# Patient Record
Sex: Female | Born: 1975 | Hispanic: Yes | Marital: Married | Smoking: Never smoker
Health system: Southern US, Community
[De-identification: ages and names within clinical notes are randomized; demographics above are authoritative.]

## PROBLEM LIST (undated history)

## (undated) DIAGNOSIS — E785 Hyperlipidemia, unspecified: Secondary | ICD-10-CM

---

## 2003-03-11 ENCOUNTER — Inpatient Hospital Stay (HOSPITAL_COMMUNITY): Admission: AD | Admit: 2003-03-11 | Discharge: 2003-03-13 | Payer: Self-pay | Admitting: Obstetrics

## 2006-10-31 ENCOUNTER — Ambulatory Visit (HOSPITAL_COMMUNITY): Admission: RE | Admit: 2006-10-31 | Discharge: 2006-10-31 | Payer: Self-pay | Admitting: Family Medicine

## 2007-04-02 ENCOUNTER — Ambulatory Visit: Payer: Self-pay | Admitting: Gynecology

## 2007-04-02 ENCOUNTER — Inpatient Hospital Stay (HOSPITAL_COMMUNITY): Admission: AD | Admit: 2007-04-02 | Discharge: 2007-04-03 | Payer: Self-pay | Admitting: Obstetrics and Gynecology

## 2007-05-21 ENCOUNTER — Emergency Department (HOSPITAL_COMMUNITY): Admission: EM | Admit: 2007-05-21 | Discharge: 2007-05-21 | Payer: Self-pay | Admitting: Emergency Medicine
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
## 2010-04-11 NOTE — L&D Delivery Note (Signed)
Delivery Note At 3:00 PM a viable female was delivered via Vaginal, Spontaneous Delivery (Presentation:ROA ) with mild shoulder dystocia relieved by McRoberts, Suprapubic pressure and rotational maneuvers <30s. NICU present at delivery for light meconium.  Spont cry.  Cord Clamped and Cut.  Infant placed on warmer with awaiting RN. APGAR: 8, 9; weight 8 lb 9.9 oz (3909 g).   Placenta status: Intact, Spontaneous.  Cord: 3 vessels with the following complications: None.  Good uterine firming with fundal massage and pitocin.  Anesthesia: None  Episiotomy: None Lacerations: none   Est. Blood Loss (mL): 400  ml  Mom to postpartum.  Baby to nursery-stable.  Romanda Turrubiates 11/04/2010, 3:30 PM

## 2010-06-02 ENCOUNTER — Other Ambulatory Visit: Payer: Self-pay | Admitting: Family Medicine

## 2010-06-02 DIAGNOSIS — Z3689 Encounter for other specified antenatal screening: Secondary | ICD-10-CM

## 2010-06-02 LAB — HIV ANTIBODY (ROUTINE TESTING W REFLEX): HIV: NONREACTIVE

## 2010-06-02 LAB — ABO/RH

## 2010-06-02 LAB — CBC
HCT: 38 % (ref 36–46)
Platelets: 149 10*3/uL — AB (ref 150–399)

## 2010-06-02 LAB — RUBELLA ANTIBODY, IGM: Rubella: IMMUNE

## 2010-06-07 ENCOUNTER — Encounter (HOSPITAL_COMMUNITY): Payer: Self-pay

## 2010-06-07 ENCOUNTER — Ambulatory Visit (HOSPITAL_COMMUNITY)
Admission: RE | Admit: 2010-06-07 | Discharge: 2010-06-07 | Disposition: A | Discharge status: Home / Self Care | Payer: Medicaid Other | Source: Ambulatory Visit | Attending: Family Medicine | Admitting: Family Medicine

## 2010-06-07 DIAGNOSIS — Z363 Encounter for antenatal screening for malformations: Secondary | ICD-10-CM | POA: Insufficient documentation

## 2010-06-07 DIAGNOSIS — Z1389 Encounter for screening for other disorder: Secondary | ICD-10-CM | POA: Insufficient documentation

## 2010-06-07 DIAGNOSIS — Z3689 Encounter for other specified antenatal screening: Secondary | ICD-10-CM

## 2010-06-07 DIAGNOSIS — O358XX0 Maternal care for other (suspected) fetal abnormality and damage, not applicable or unspecified: Secondary | ICD-10-CM | POA: Insufficient documentation
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
## 2010-10-08 LAB — STREP B DNA PROBE: GBS: NEGATIVE

## 2010-11-04 ENCOUNTER — Encounter (HOSPITAL_COMMUNITY): Payer: Self-pay

## 2010-11-04 ENCOUNTER — Inpatient Hospital Stay (HOSPITAL_COMMUNITY)
Admission: AD | Admit: 2010-11-04 | Discharge: 2010-11-06 | DRG: 775 | Disposition: A | Discharge status: Home / Self Care | Payer: Medicaid Other | Source: Ambulatory Visit | Attending: Obstetrics & Gynecology | Admitting: Obstetrics & Gynecology

## 2010-11-04 DIAGNOSIS — Z862 Personal history of diseases of the blood and blood-forming organs and certain disorders involving the immune mechanism: Secondary | ICD-10-CM

## 2010-11-04 DIAGNOSIS — O0930 Supervision of pregnancy with insufficient antenatal care, unspecified trimester: Secondary | ICD-10-CM

## 2010-11-04 DIAGNOSIS — IMO0001 Reserved for inherently not codable concepts without codable children: Secondary | ICD-10-CM

## 2010-11-04 LAB — CBC
MCH: 31 pg (ref 26.0–34.0)
MCV: 93.3 fL (ref 78.0–100.0)
Platelets: 145 10*3/uL — ABNORMAL LOW (ref 150–400)
RDW: 14.9 % (ref 11.5–15.5)
WBC: 12.7 10*3/uL — ABNORMAL HIGH (ref 4.0–10.5)

## 2010-11-04 LAB — RPR: RPR Ser Ql: NONREACTIVE

## 2010-11-04 MED ORDER — LACTATED RINGERS IV SOLN: 500.0000 mL | INTRAVENOUS | Status: DC | PRN

## 2010-11-04 MED ORDER — FENTANYL CITRATE 0.05 MG/ML IJ SOLN
100.0000 ug | Freq: Once | INTRAMUSCULAR | Status: AC
  Administered 2010-11-04: 100 ug via INTRAVENOUS
  Filled 2010-11-04: qty 2

## 2010-11-04 MED ORDER — BENZOCAINE-MENTHOL 20-0.5 % EX AERO: 1.0000 "application " | INHALATION_SPRAY | CUTANEOUS | Status: DC | PRN

## 2010-11-04 MED ORDER — LIDOCAINE HCL (PF) 1 % IJ SOLN
30.0000 mL | INTRAMUSCULAR | Status: DC | PRN
  Filled 2010-11-04 (×2): qty 30

## 2010-11-04 MED ORDER — HYDROXYZINE HCL 50 MG PO TABS
50.0000 mg | ORAL_TABLET | Freq: Four times a day (QID) | ORAL | Status: DC | PRN
  Filled 2010-11-04: qty 1

## 2010-11-04 MED ORDER — OXYTOCIN 20 UNITS IN LACTATED RINGERS INFUSION - SIMPLE
125.0000 mL/h | Freq: Once | INTRAVENOUS | Status: AC
  Administered 2010-11-04: 999 mL/h via INTRAVENOUS
  Filled 2010-11-04: qty 1000

## 2010-11-04 MED ORDER — TETANUS-DIPHTH-ACELL PERTUSSIS 5-2.5-18.5 LF-MCG/0.5 IM SUSP
0.5000 mL | Freq: Once | INTRAMUSCULAR | Status: AC
  Administered 2010-11-05: 0.5 mL via INTRAMUSCULAR

## 2010-11-04 MED ORDER — SIMETHICONE 80 MG PO CHEW: 80.0000 mg | CHEWABLE_TABLET | ORAL | Status: DC | PRN

## 2010-11-04 MED ORDER — ZOLPIDEM TARTRATE 5 MG PO TABS: 5.0000 mg | ORAL_TABLET | Freq: Every evening | ORAL | Status: DC | PRN

## 2010-11-04 MED ORDER — DIPHENHYDRAMINE HCL 25 MG PO CAPS: 25.0000 mg | ORAL_CAPSULE | Freq: Four times a day (QID) | ORAL | Status: DC | PRN

## 2010-11-04 MED ORDER — NALBUPHINE SYRINGE 5 MG/0.5 ML
5.0000 mg | INJECTION | INTRAMUSCULAR | Status: DC | PRN
  Administered 2010-11-04: 5 mg via INTRAVENOUS
  Filled 2010-11-04 (×2): qty 0.5

## 2010-11-04 MED ORDER — HYDROXYZINE HCL 50 MG/ML IM SOLN
50.0000 mg | Freq: Four times a day (QID) | INTRAMUSCULAR | Status: DC | PRN
  Filled 2010-11-04: qty 1

## 2010-11-04 MED ORDER — LANOLIN HYDROUS EX OINT: TOPICAL_OINTMENT | CUTANEOUS | Status: DC | PRN

## 2010-11-04 MED ORDER — ONDANSETRON HCL 4 MG/2ML IJ SOLN: 4.0000 mg | INTRAMUSCULAR | Status: DC | PRN

## 2010-11-04 MED ORDER — LACTATED RINGERS IV SOLN
INTRAVENOUS | Status: DC
  Administered 2010-11-04: 10:00:00 via INTRAVENOUS

## 2010-11-04 MED ORDER — ACETAMINOPHEN 325 MG PO TABS: 650.0000 mg | ORAL_TABLET | ORAL | Status: DC | PRN

## 2010-11-04 MED ORDER — OXYCODONE-ACETAMINOPHEN 5-325 MG PO TABS: 1.0000 | ORAL_TABLET | ORAL | Status: DC | PRN

## 2010-11-04 MED ORDER — ONDANSETRON HCL 4 MG PO TABS: 4.0000 mg | ORAL_TABLET | ORAL | Status: DC | PRN

## 2010-11-04 MED ORDER — NALBUPHINE SYRINGE 5 MG/0.5 ML
10.0000 mg | INJECTION | Freq: Once | INTRAMUSCULAR | Status: AC
  Administered 2010-11-04: 10 mg via INTRAVENOUS
  Filled 2010-11-04: qty 1

## 2010-11-04 MED ORDER — IBUPROFEN 600 MG PO TABS
600.0000 mg | ORAL_TABLET | Freq: Four times a day (QID) | ORAL | Status: DC
  Administered 2010-11-05 – 2010-11-06 (×6): 600 mg via ORAL
  Filled 2010-11-04 (×7): qty 1

## 2010-11-04 MED ORDER — PRENATAL PLUS 27-1 MG PO TABS
1.0000 | ORAL_TABLET | Freq: Every day | ORAL | Status: DC
  Administered 2010-11-05 – 2010-11-06 (×2): 1 via ORAL
  Filled 2010-11-04 (×2): qty 1

## 2010-11-04 MED ORDER — WITCH HAZEL-GLYCERIN EX PADS: MEDICATED_PAD | CUTANEOUS | Status: DC | PRN

## 2010-11-04 MED ORDER — CITRIC ACID-SODIUM CITRATE 334-500 MG/5ML PO SOLN: 30.0000 mL | ORAL | Status: DC | PRN

## 2010-11-04 MED ORDER — FLEET ENEMA 7-19 GM/118ML RE ENEM: 1.0000 | ENEMA | RECTAL | Status: DC | PRN

## 2010-11-04 MED ORDER — ONDANSETRON HCL 4 MG/2ML IJ SOLN: 4.0000 mg | Freq: Four times a day (QID) | INTRAMUSCULAR | Status: DC | PRN

## 2010-11-04 MED ORDER — OXYCODONE-ACETAMINOPHEN 5-325 MG PO TABS
2.0000 | ORAL_TABLET | ORAL | Status: DC | PRN
  Administered 2010-11-04: 2 via ORAL
  Filled 2010-11-04: qty 2

## 2010-11-04 MED ORDER — SENNOSIDES-DOCUSATE SODIUM 8.6-50 MG PO TABS
1.0000 | ORAL_TABLET | Freq: Every day | ORAL | Status: DC
  Administered 2010-11-04: 2 via ORAL
  Administered 2010-11-05: 1 via ORAL

## 2010-11-04 MED ORDER — IBUPROFEN 600 MG PO TABS
600.0000 mg | ORAL_TABLET | Freq: Four times a day (QID) | ORAL | Status: DC | PRN
  Administered 2010-11-04: 600 mg via ORAL
  Filled 2010-11-04: qty 1

## 2010-11-04 NOTE — Progress Notes (Signed)
Pt transferred [facility] Room 148 via w/c with belongings in stable condition with baby in crib and husband.

## 2010-11-04 NOTE — Progress Notes (Signed)
Pt states she is having contractions every 3 minutes. No bleeding and reports good fetal movement.

## 2010-11-04 NOTE — Progress Notes (Signed)
  Yarlin Breisch is a 35 y.o. G3P2002 at [redacted]w[redacted]d by LMP admitted for active labor  Subjective: Pt AROM: light meconium. Pt still having 8/10 pain with contractions.  Objective: BP 141/73  Pulse 75  Temp(Src) 98.4 F (36.9 C) (Oral)  Resp 18  Ht 5\' 2"  (1.575 m)  Wt 172 lb (78.019 kg)  BMI 31.46 kg/m2  SpO2 97%   I/O this shift: In: 370.8 [I.V.:370.8] Out: -   FHT:  FHR: 150 bpm, variability: moderate,  accelerations:  Present,  decelerations:  Present early UC:   regular, every 2-3 minutes SVE:   Dilation: Lip/rim Effacement (%): 100 Station: 0;+1 Exam by:: Dr. Gwenlyn Saran  Labs: Lab Results  Component Value Date   WBC 12.7* 11/04/2010   HGB 13.5 11/04/2010   HCT 40.6 11/04/2010   MCV 93.3 11/04/2010   PLT 145* 11/04/2010    Assessment / Plan: Spontaneous labor, progressing normally, s/p AROM  Labor: Progressing normally Fetal Wellbeing:  Category I Pain Control:  Fentanyl I/D:  GBS pos Anticipated MOD:  NSVD  Kalani Baray 11/04/2010, 2:15 PM

## 2010-11-04 NOTE — Consult Note (Signed)
Delivery Note   11/04/2010  3:05 PM  Requested by Dr. Lynetta Mare  to attend this vaginal delivery  for  [facility].   Born to a 35 y/o G3P2 mother with PNC  A+Ab-    and negative screens.    AROM  One hour PTD with light MSAF.  MOB received fentanyl less than an hour PTD. Infant handed to Neo crying.  Dried, bulb suctioned and kept warm.  APGAR 8 and 9.  Care transfer to Peds. Teaching service.   Chales Abrahams V.T. Sidney Silberman, MD Neonatologist

## 2010-11-04 NOTE — H&P (Cosign Needed)
  Elizabeth Gilbert is a 35 y.o. G3P2002 at 40.2 by LMP (EDD: 7?24/12) presenting for active labor, with contractions starting at 5am, 8/10 pain every 3-28min. No bleeding, no loss of fluid, no vaginal discharge. Reports good fetal movement. Denies any complications during the pregnancy.  History OB History    Grav Para Term Preterm Abortions TAB SAB Ect Mult Living   3 2 2       2     1st pregnancy: 2004 at 33.4 NSVD 2nd pregnancy: 2008, 40.3 NSVD with thrombocytopenia A+ GYN: [facility]IL pap in 2005 Medical history: varicose veins Surgical history: none Family History: family history is not on file. Social History: denies smoking, alcohol or drugs ROS Negative except per HPI   Dilation: 6 Effacement (%): 100 Station: 0 Exam by:: Marcha Solders, RN Blood pressure 123/77, pulse 84, temperature 98.8 F (37.1 C), temperature source Oral, resp. rate 20, SpO2 97.00%. Exam Physical Exam  Gen: A+O x3, in pain during contractions CV: S1S2, no murmurs, rubs or gallops Chest: CTA B/L Abdomen: gravid Extr: no cyanosis, clubbing, erythema or edema. Mild soreness on flexion of right foot.  Prenatal labs: ABO, Rh:  A+ Antibody:  neg Rubella:  immune RPR:   neg HBsAg:   neg HIV:   neg GBS:   neg  Assessment/Plan: Pt in active labor. IV pain management (Nubain). Continue expectant management GBS neg   Elizabeth Gilbert 11/04/2010, 10:21 AM

## 2010-11-04 NOTE — Progress Notes (Signed)
  Elizabeth Gilbert is a 35 y.o. G3P2002 at [redacted]w[redacted]d by LMP admitted for active labor  Subjective: Pt had some relief of pain with nubain. No complaints.   Objective: BP 127/69  Pulse 67  Temp(Src) 98.1 F (36.7 C) (Oral)  Resp 18  Ht 5\' 2"  (1.575 m)  Wt 172 lb (78.019 kg)  BMI 31.46 kg/m2  SpO2 97%   I/O this shift: In: 166.7 [I.V.:166.7] Out: -   FHT:  FHR: 145 bpm, variability: moderate,  accelerations:  Present,  decelerations:  Absent UC:   regular, every 2-3 minutes SVE:   Dilation: 8 Effacement (%): 100 Station: 0;+1 Exam by:: Marcha Solders, RN  Labs: Lab Results  Component Value Date   WBC 12.7* 11/04/2010   HGB 13.5 11/04/2010   HCT 40.6 11/04/2010   MCV 93.3 11/04/2010   PLT 145* 11/04/2010    Assessment / Plan: Spontaneous labor, progressing normally  Labor: Progressing normally Fetal Wellbeing:  Category I Pain Control:  IV Nubain I/D:  GBS neg Anticipated MOD:  NSVD  Elizabeth Gilbert 11/04/2010, 12:51 PM

## 2010-11-04 NOTE — Progress Notes (Signed)
11/04/10 1220  Fetal Heart Rate A  Baseline Rate 140 bpm   FHT's after pain medication

## 2010-11-04 NOTE — Progress Notes (Signed)
Pt is a G3P2 presenting in active labor. Pt is Spanish-Speaking. Interpreter her to translate. Pt is uncomfortable with contractions and requesting something for pain. Armband verified. CHG hygiene completed. Safety video initiated in Spanish. Admission assessment initiated. Plan of care communicated via Interpreter. Pt verbalized understanding of plan.

## 2010-11-04 NOTE — Progress Notes (Signed)
11/04/10 1023  Fetal Heart Rate A  Baseline Rate 145 bpm   FHT's after pain medication

## 2010-11-04 NOTE — Progress Notes (Signed)
11/04/10 1025  Pain Assessment  Pain Orientation Right;Other (Comment)   c/o of pain when flexing right foot; varicosity posterior side under knee

## 2010-11-04 NOTE — Progress Notes (Signed)
11/04/2010 Sargon Scouten  Interpreter  I assisted Faculty [employer] with questions.

## 2010-11-04 NOTE — Progress Notes (Signed)
NSVD of viable female. Apgars 8/9. Placenta delivered intact. Baby taken to warmer due to light meconium and shoulder dystocia. Baby placed skin-to-skin on mother's chest after 10 min. Bonding noted.

## 2010-11-04 NOTE — H&P (Signed)
Elizabeth Gilbert is a 35 y.o. female presenting for labor. She is 40.2 weeks by LMP, verified by 2nd trimester [place]. EDD 11/02/10 Maternal Medical History:  Reason for admission: Reason for admission: contractions.  Contractions: Onset was 3-5 hours ago.   Frequency: regular.   Duration is approximately 60 seconds.   Perceived severity is strong.    Fetal activity: Perceived fetal activity is decreased.   Last perceived fetal movement was within the past hour.    Prenatal Complications - Diabetes: none.    Patient Active Problem List  Diagnoses  . History of thrombocytopenia  . Late prenatal care  . Active labor    OB History    Grav Para Term Preterm Abortions TAB SAB Ect Mult Living   3 2 2       2      MH: abn Pap, umbilical hernia No past surgical history on file. Family History: family history is not on file. Social History:  does not have a smoking history on file. She does not have any smokeless tobacco history on file. Her alcohol and drug histories not on file.  Review of Systems  All other systems reviewed and are negative.    Dilation: 4 Effacement (%): 80 Exam by:: SBeck, RN Blood pressure 123/77, pulse 84, temperature 98.8 F (37.1 C), temperature source Oral, resp. rate 20, SpO2 97.00%. Maternal Exam:  Uterine Assessment: Contraction strength is moderate.  Contraction duration is 60 seconds. Contraction frequency is regular.   Abdomen: Patient reports no abdominal tenderness. Estimated fetal weight is S=D.   Fetal presentation: vertex  Introitus: Normal vulva. Normal vagina.  intact  Pelvis: adequate for delivery.   Cervix: Cervix evaluated by digital exam.     Fetal Exam Fetal Monitor Review: Mode: ultrasound.   Baseline rate: 140-150.  Variability: minimal (<5 bpm).   Pattern: no accelerations and no decelerations.    Fetal State Assessment: Category II - tracings are indeterminate.     Physical Exam  Constitutional: She is oriented to person,  place, and time. She appears well-developed and well-nourished.  HENT:  Head: Normocephalic.  Cardiovascular: Normal rate, regular rhythm and normal heart sounds.   Respiratory: Effort normal and breath sounds normal.  GI: Soft. Bowel sounds are normal.  Genitourinary: Vagina normal and uterus normal. No bleeding around the vagina.  Musculoskeletal: Normal range of motion.  Neurological: She is alert and oriented to person, place, and time. She has normal reflexes.  Skin: Skin is warm and dry.  Psychiatric: She has a normal mood and affect.    Prenatal labs: ABO, Rh:  A pos Antibody:  neg Rubella:  Immune RPR:   NR HBsAg:   neg HIV:   NR GBS:   neg 1 hour GTT: 98/129 Assessment/Plan: Assessment: 1. Labor: active  2. Fetal Wellbeing: Category II  3. Pain Control: none 4. GBS: neg 5. 40.2 week IUP  Plan:  1. Admit to BS per consult with MD 2. Routine L&D orders 3. Analgesia/anesthesia PRN       Dorathy Kinsman 11/04/2010, 9:44 AM

## 2010-11-04 NOTE — Progress Notes (Signed)
Pt progressing with labor. Pain being managed with Nubain. Dr. Gwenlyn Saran here to evaluate pt status. Will continue to monitor.

## 2010-11-04 NOTE — Progress Notes (Signed)
Mom requesting bottle of formula to sleep tonight.  Demonstrated and taught through interpreter that she could easily express lots of colostrum.  Encouraged to exclusively breastfeed since had plenty of colostrum, Ethelene Browns had a good latch, and left nipple semi-flat with slight inversion in center and want to prevent nipple confusion.  Harmony pump given with instructions in use to help evert left nipple.  Unable to observe latch, but mom and RN reports baby breastfed excellent.

## 2010-11-04 NOTE — Progress Notes (Signed)
11/04/2010 Sabrine Patchen  Interpreter  I assisted Lupita Leash RN in triage.

## 2010-11-05 NOTE — Progress Notes (Signed)
UR Chart review completed.  

## 2010-11-05 NOTE — Progress Notes (Signed)
  Post Partum Day #1 Subjective: no complaints, up ad lib, voiding, tolerating PO and + flatus  Objective: Blood pressure 100/62, pulse 81, temperature 98.4 F (36.9 C), temperature source Oral, resp. rate 18, height 5\' 2"  (1.575 m), weight 172 lb (78.019 kg), SpO2 98.00%.  Physical Exam:  General: alert and cooperative Lochia: appropriate Uterine Fundus: firm DVT Evaluation: No evidence of DVT seen on physical exam. Negative Homan's sign. No cords or calf tenderness. No significant calf/ankle edema.   Basename 11/04/10 1000  HGB 13.5  HCT 40.6    Assessment/Plan: Plan for discharge tomorrow, Breastfeeding, Lactation consult and Contraception micronor, female infant- no circumcision.  Motrin as needed for pain- pain well controlled   LOS: 1 day   Fumio Vandam 11/05/2010, 7:57 AM

## 2010-11-06 MED ORDER — NORETHINDRONE 0.35 MG PO TABS: 1.0000 | ORAL_TABLET | Freq: Every day | ORAL | Status: DC

## 2010-11-06 MED ORDER — IBUPROFEN 600 MG PO TABS: 600.0000 mg | ORAL_TABLET | Freq: Four times a day (QID) | ORAL | Status: AC

## 2010-11-06 MED ORDER — DOCUSATE SODIUM 100 MG PO CAPS: 100.0000 mg | ORAL_CAPSULE | Freq: Two times a day (BID) | ORAL | Status: AC | PRN

## 2010-11-06 NOTE — Progress Notes (Signed)
Mother giving mostly bottles with formula. Encouraged to breast feed first and then pump and give own breastmilk instead of formula. Father interpreted information to mother. Mom has hand pump and has understanding of use.

## 2010-11-06 NOTE — Discharge Summary (Signed)
Obstetric Discharge Summary Reason for Admission: onset of labor Prenatal Procedures: NST Intrapartum Procedures: spontaneous vaginal delivery Postpartum Procedures: none Complications-Operative and Postpartum: none  Hemoglobin  Date Value Range Status  11/04/2010 13.5  12.0-15.0 (g/dL) Final     HCT  Date Value Range Status  11/04/2010 40.6  36.0-46.0 (%) Final    Discharge Diagnoses: Term Pregnancy-delivered  Discharge Information: Date: 11/06/2010 Activity: pelvic rest Diet: routine Medications: PNV, Ibuprophen, Colace and micronor Condition: stable Instructions: refer to practice specific booklet Discharge to: home Follow-up Information    Follow up with [facility] [place]. Make an appointment in 6 weeks. (postpartum check)    Contact information:   [address] [facility] [place] [address]          Newborn Data: Live born  Information for the patient's newborn:  Haillie, Radu [[id number]]  female ; APGAR 8,9 ; weight 8 lbs 9.9 oz ;  Home with mother.  Jackolyn Geron 11/06/2010, 9:04 AM

## 2010-11-06 NOTE — Progress Notes (Signed)
Post Partum Day 2 Subjective: no complaints, tolerating PO and + flatus, thin lochia, absent BM, plans to breastfeed, plans to bottle feed, oral progesterone-only contraceptive  Objective: Blood pressure 100/62, pulse 82, temperature 98.1 F (36.7 C), temperature source Oral, resp. rate 20, height 5\' 2"  (1.575 m), weight 78.019 kg (172 lb), SpO2 98.00%.  Physical Exam:  General: alert and cooperative Lochia: appropriate Chest: CTAB Heart: RRR no m/r/g Abdomen: +BS, soft, nontender,  Uterine Fundus: firm @ umbilicus DVT Evaluation: No evidence of DVT seen on physical exam. Extremities: no c/c/e   Basename 11/04/10 1000  HGB 13.5  HCT 40.6    Assessment/Plan: Discharge home   LOS: 2 days   Ambur Province 11/06/2010, 8:56 AM

## 2010-11-19 ENCOUNTER — Encounter (HOSPITAL_COMMUNITY): Payer: Self-pay | Admitting: *Deleted

## 2010-12-31 LAB — I-STAT 8, (EC8 V) (CONVERTED LAB)
BUN: 13
Glucose, Bld: 102 — ABNORMAL HIGH
Hemoglobin: 15.6 — ABNORMAL HIGH
Potassium: 3.9
Sodium: 136

## 2010-12-31 LAB — POCT I-STAT CREATININE
Creatinine, Ser: 0.8
Operator id: 151321

## 2011-01-14 LAB — CBC
Hemoglobin: 11.2 — ABNORMAL LOW
MCV: 96.3
Platelets: 143 — ABNORMAL LOW
RDW: 14.3
WBC: 10

## 2011-01-14 LAB — RPR: RPR Ser Ql: NONREACTIVE

## 2011-07-04 IMAGING — US US OB DETAIL+14 WK
1 series · 12 of 28 positions shown · non-contrast
Comparison: none

[Series 1: us ob detail +14 wk · 70 acquisitions, 12 frames shown]
[im 3/70]
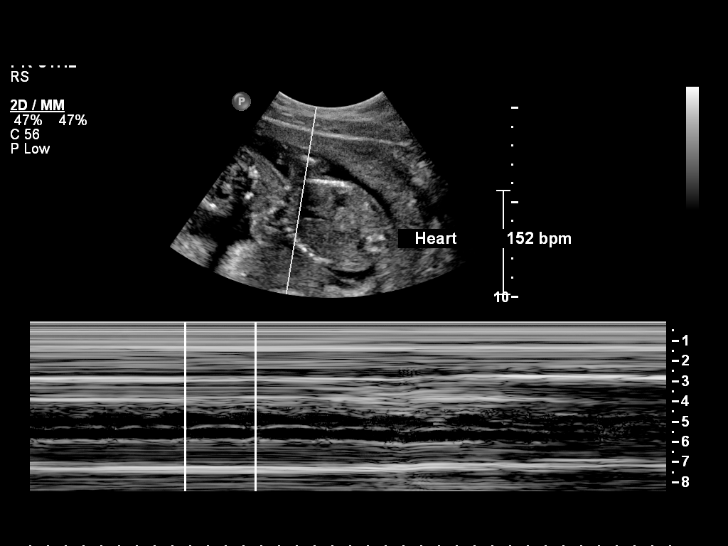
[im 8/70]
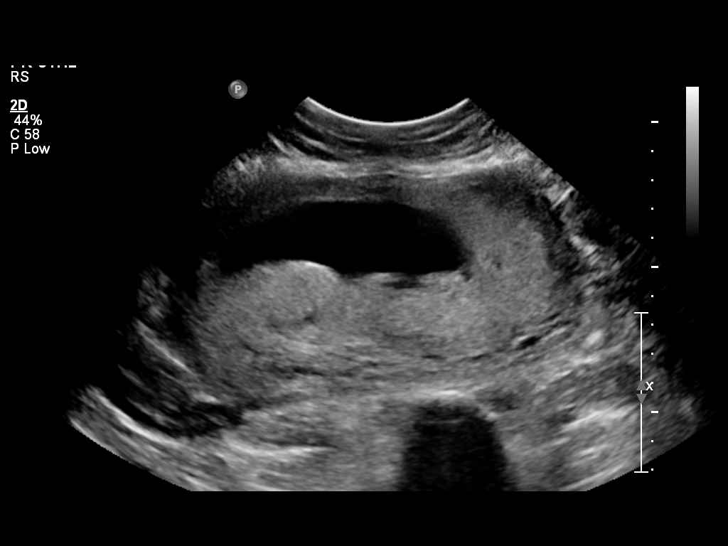
[im 13/70]
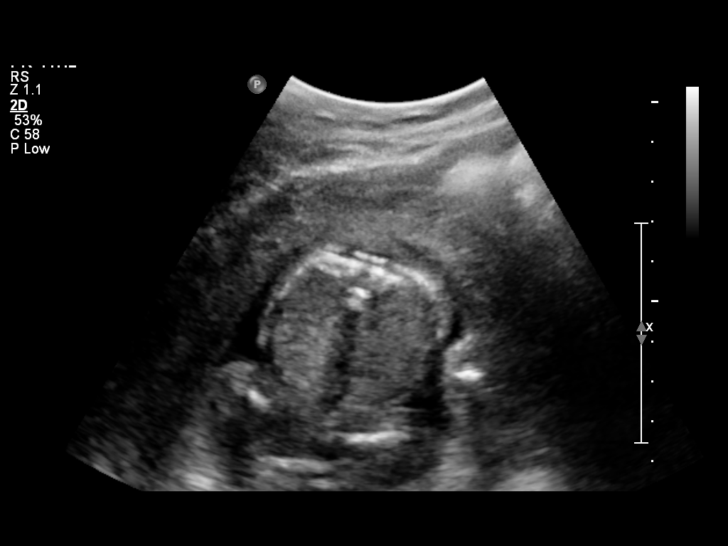
[im 21/70]
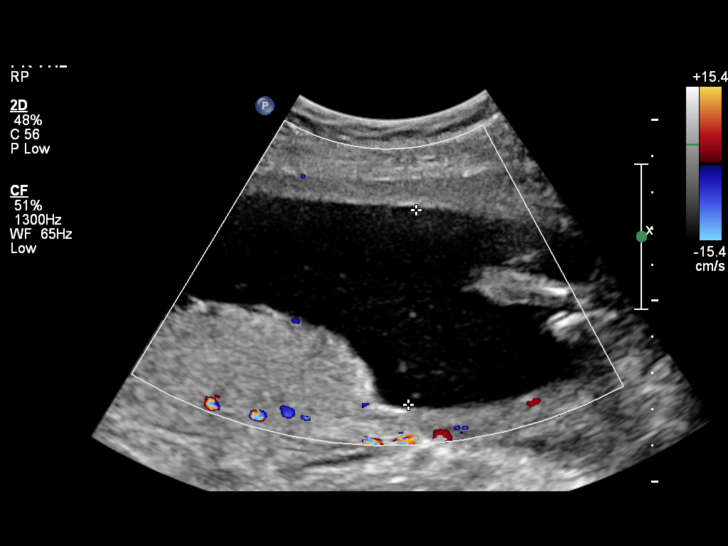
[im 26/70]
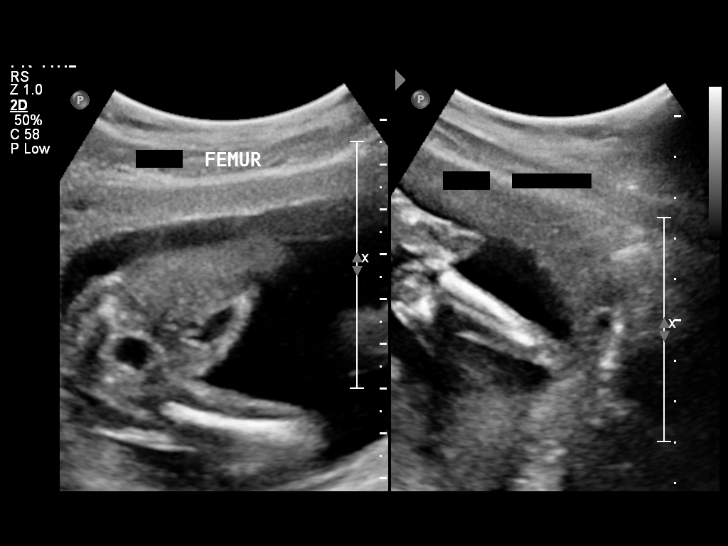
[im 31/70]
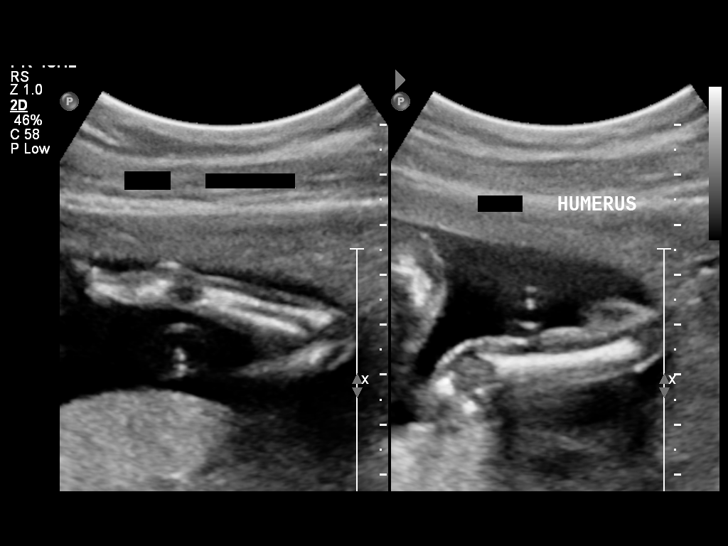
[im 39/70]
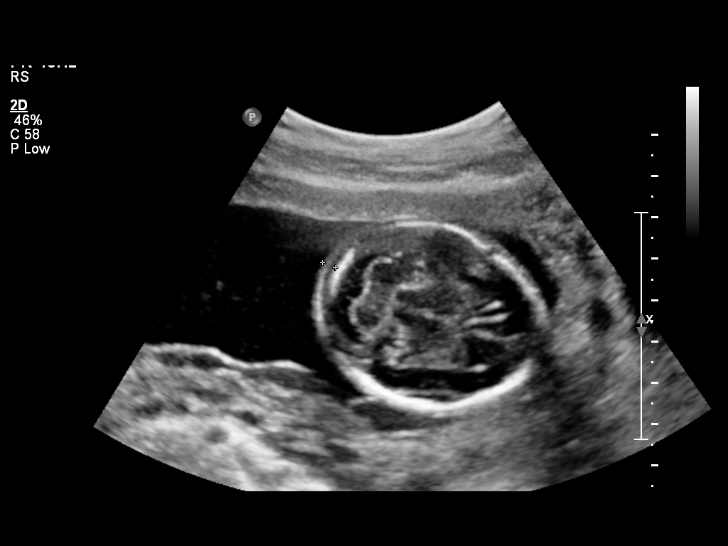
[im 44/70]
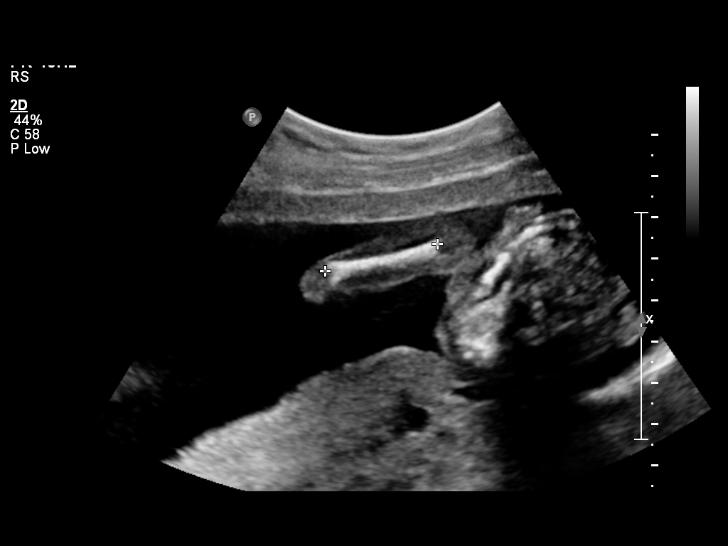
[im 49/70]
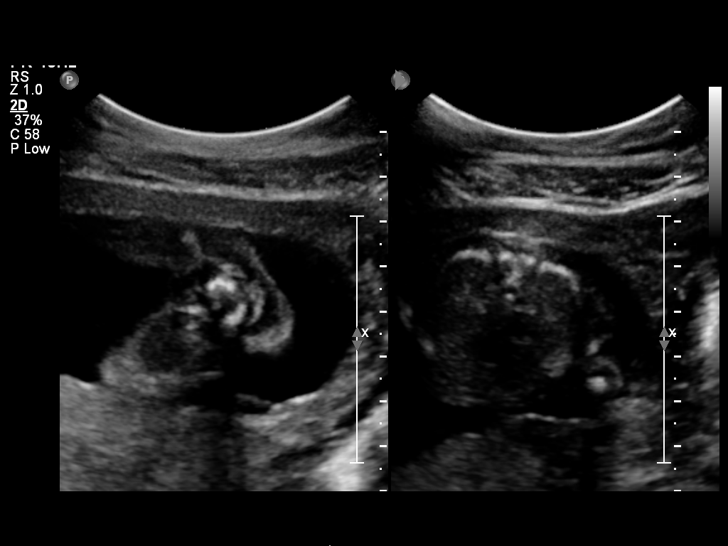
[im 57/70]
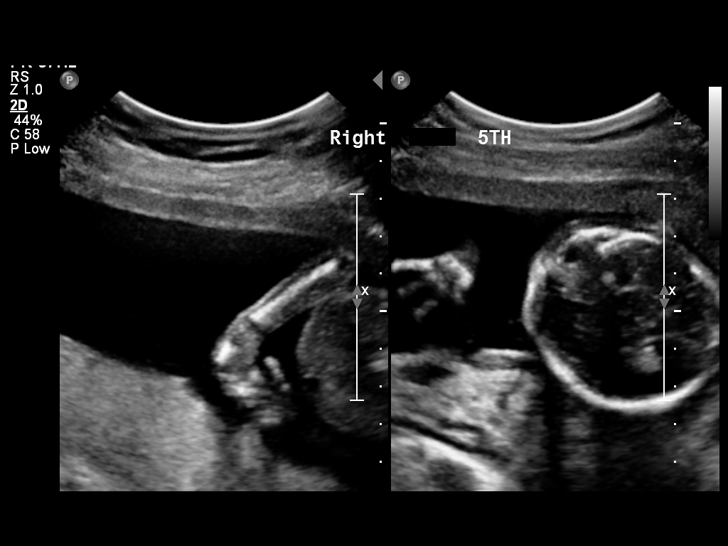
[im 62/70]
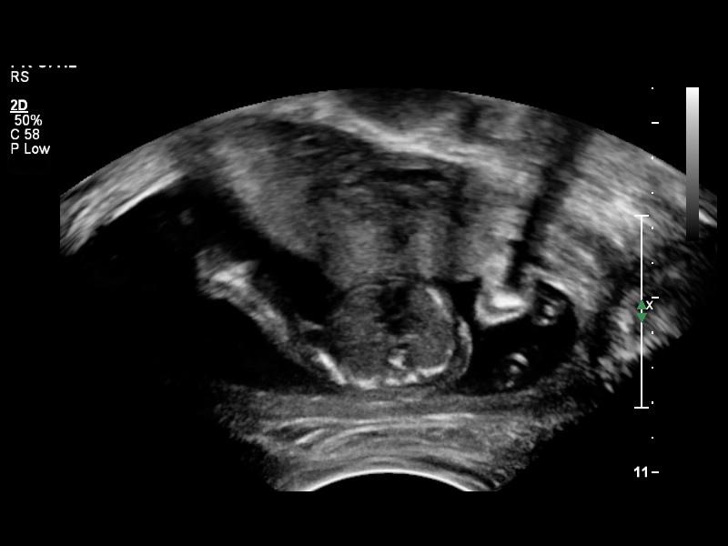
[im 67/70]
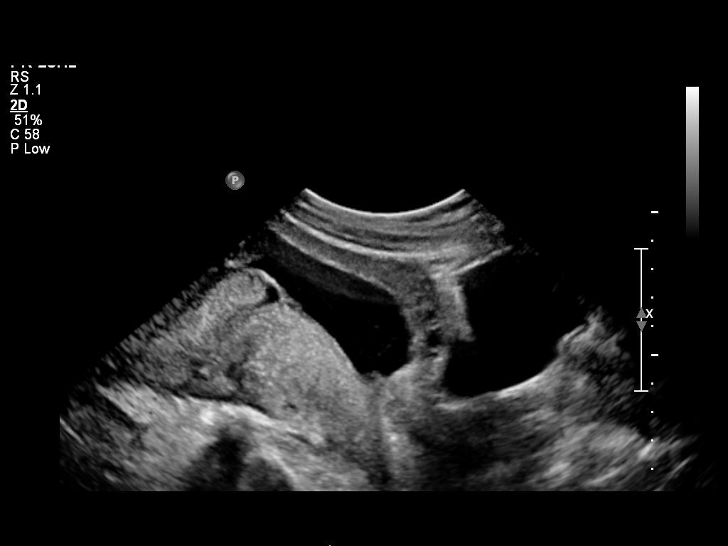

[12 of 28 positions shown; findings below may reference images not displayed]

OBSTETRICS REPORT
                      (Signed Final 06/07/2010 [DATE])

 Order#:         [id number]
Procedures

 US OB DETAIL + 14 WK                                  76811.0
Indications

 Detailed fetal anatomic survey
Fetal Evaluation

 Fetal Heart Rate:  152                          bpm
 Cardiac Activity:  Observed
 Presentation:      Variable
 Placenta:          Posterior, above cervical
                    os
 P. Cord            Visualized
 Insertion:

 Amniotic Fluid
 AFI FV:      Subjectively within normal limits
                                             Larg Pckt:     5.5  cm
Biometry

 BPD:     43.5  mm     G. Age:  19w 1d                CI:        74.39   70 - 86
                                                      FL/HC:      18.2   16.1 -

 HC:     160.1  mm     G. Age:  18w 6d       42  %    HC/AC:      1.18   1.09 -

 AC:       136  mm     G. Age:  19w 0d       52  %    FL/BPD:
 FL:      29.1  mm     G. Age:  19w 0d       47  %    FL/AC:      21.4   20 - 24
 HUM:     27.8  mm     G. Age:  18w 6d       52  %
 NFT:      3.3  mm

 Est. FW:     268  gm      0 lb 9 oz     48  %
Gestational Age

 LMP:           18w 6d        Date:  01/26/10                 EDD:   11/02/10
 U/S Today:     19w 0d                                        EDD:   11/01/10
 Best:          18w 6d     Det. By:  LMP  (01/26/10)          EDD:   11/02/10
Anatomy
 Cranium:           Appears normal      Aortic Arch:       Appears normal
 Fetal Cavum:       Appears normal      Ductal Arch:       Appears normal
 Ventricles:        Appears normal      Diaphragm:         Appears normal
 Choroid Plexus:    Appears normal      Stomach:           Appears normal
 Cerebellum:        Appears normal      Abdomen:           Appears normal
 Posterior Fossa:   Appears normal      Abdominal Wall:    Appears nml
                                                           (cord insert,
                                                           abd wall)
 Nuchal Fold:       Appears normal      Cord Vessels:      Appears normal
                    (neck, nuchal                          (3 vessel cord)
                    fold)
 Face:              Appears normal      Kidneys:           Appear normal
                    (lips/profile/orbit
                    s)
 Heart:             Appears normal      Bladder:           Appears normal
                    (4 chamber &
                    axis)
 RVOT:              Appears normal      Spine:             Appears normal
 LVOT:              Appears normal      Limbs:             Appears normal
                                                           (hands, ankles,
                                                           feet)

 Other:     Male gender. Heels and 5th digit visualized.
Cervix Uterus Adnexa

 Cervical Length:    3.7      cm

 Cervix:       Closed.

 Adnexa:     No abnormality visualized.
Impression

 Siup demonstrating an EGA by ultrasound of 19w 0d. This
 corresponds well with expected EGA by LMP of 18w 6d.

 No focal fetal or placental abnormalities are noted with a
 good anatomic evaluation possible. No soft markers for Down
 Syndrome are seen. Given the expected age at delivery of
 34, today's normal ultrasound would decrease the age related
 risk for Down Syndrome from [DATE] to [DATE] (Choz et al).
 Correlation with other aneuploidy screening results, if
 available, would be recommended for a more complete risk
 assessment.

 Subjectively and quantitatively normal amniotic fluid volume.
 Normal cervical length.

 questions or concerns.

## 2014-02-10 ENCOUNTER — Encounter (HOSPITAL_COMMUNITY): Payer: Self-pay | Admitting: *Deleted

## 2014-07-31 ENCOUNTER — Ambulatory Visit (INDEPENDENT_AMBULATORY_CARE_PROVIDER_SITE_OTHER): Payer: Self-pay | Admitting: Family Medicine

## 2014-07-31 VITALS — BP 142/78 | HR 87 | Temp 97.9°F | Resp 16 | Ht 63.75 in | Wt 153.4 lb

## 2014-07-31 DIAGNOSIS — J209 Acute bronchitis, unspecified: Secondary | ICD-10-CM

## 2014-07-31 DIAGNOSIS — J9801 Acute bronchospasm: Secondary | ICD-10-CM

## 2014-07-31 MED ORDER — AZITHROMYCIN 250 MG PO TABS: ORAL_TABLET | ORAL | Status: DC

## 2014-07-31 MED ORDER — ALBUTEROL SULFATE HFA 108 (90 BASE) MCG/ACT IN AERS: 2.0000 | INHALATION_SPRAY | Freq: Four times a day (QID) | RESPIRATORY_TRACT | Status: DC | PRN

## 2014-07-31 MED ORDER — PREDNISONE 20 MG PO TABS
ORAL_TABLET | ORAL | Status: DC
Start: 2014-07-31 — End: 2019-09-20

## 2014-07-31 NOTE — Progress Notes (Signed)
[facility] [address], [place] [id number] [phone]  Date:  07/31/2014   Name:  Elizabeth Gilbert   DOB:  May 19, 1975   MRN:  [id number]  PCP:  No primary care provider on file.    Chief Complaint: Cough   History of Present Illness:  Elizabeth Gilbert is a 39 y.o. very pleasant female patient who presents with the following:  Here today with complaint of illness for about 2 weeks. She has cough and wheeze.  She has not noted a fever.  Cough is generally dry but she does get some phlegm in her throat.  She is sneezing, eyes are itching but she does not really have any nasal sx.  She is generally healthy  LMP 4/7 She is taking claritin.  She is not pregnant or nursing at this time  Patient Active Problem List   Diagnosis Date Noted  . History of thrombocytopenia 11/04/2010  . Late prenatal care 11/04/2010  . Active labor 11/04/2010    No past medical history on file.  No past surgical history on file.  History  Substance Use Topics  . Smoking status: Never Smoker   . Smokeless tobacco: Not on file  . Alcohol Use: No    No family history on file.  No Known Allergies  Medication list has been reviewed and updated.  No current outpatient prescriptions on file prior to visit.   No current facility-administered medications on file prior to visit.    Review of Systems:  As per HPI- otherwise negative.   Physical Examination: Filed Vitals:   07/31/14 0944  BP: 142/78  Pulse: 87  Temp: 97.9 F (36.6 C)  Resp: 16   Filed Vitals:   07/31/14 0944  Height: 5' 3.75" (1.619 m)  Weight: 153 lb 6.4 oz (69.582 kg)   Body mass index is 26.55 kg/(m^2). Ideal Body Weight: Weight in (lb) to have BMI = 25: 144.2  GEN: WDWN, NAD, Non-toxic, A & O x 3, mild overweight, looks well HEENT: Atraumatic, Normocephalic. Neck supple. No masses, No LAD.  Bilateral TM wnl, oropharynx normal.  PEERL,EOMI.   Ears and Nose: No external deformity. CV: RRR, No  M/G/R. No JVD. No thrill. No extra heart sounds. PULM: CTA B, no wheezes, crackles, rhonchi. No retractions. No resp. distress. No accessory muscle use. Frequent cough in room ABD: S, NT, ND, +BS. No rebound. No HSM. EXTR: No c/c/e NEURO Normal gait.  PSYCH: Normally interactive. Conversant. Not depressed or anxious appearing.  Calm demeanor.    Assessment and Plan: Acute bronchitis, unspecified organism - Plan: azithromycin (ZITHROMAX) 250 MG tablet  Bronchospasm - Plan: albuterol (PROVENTIL HFA;VENTOLIN HFA) 108 (90 BASE) MCG/ACT inhaler, predniSONE (DELTASONE) 20 MG tablet    Signed Abbe AmsterdamJessica Jayleen Scaglione, MD

## 2014-07-31 NOTE — Patient Instructions (Signed)
Use the azithromycin (antibiotic) and prednisone as directed Use the albuterol as needed for wheezing and coughing. Let me know if you are not feeling better soon!

## 2014-11-24 ENCOUNTER — Ambulatory Visit (INDEPENDENT_AMBULATORY_CARE_PROVIDER_SITE_OTHER): Payer: Self-pay | Admitting: Emergency Medicine

## 2014-11-24 VITALS — BP 132/70 | HR 97 | Temp 98.6°F | Resp 18 | Ht 64.0 in | Wt 152.0 lb

## 2014-11-24 DIAGNOSIS — J0140 Acute pansinusitis, unspecified: Secondary | ICD-10-CM

## 2014-11-24 DIAGNOSIS — N898 Other specified noninflammatory disorders of vagina: Secondary | ICD-10-CM

## 2014-11-24 DIAGNOSIS — R300 Dysuria: Secondary | ICD-10-CM

## 2014-11-24 DIAGNOSIS — L298 Other pruritus: Secondary | ICD-10-CM

## 2014-11-24 LAB — POCT UA - MICROSCOPIC ONLY
BACTERIA, U MICROSCOPIC: NEGATIVE
CASTS, UR, LPF, POC: NEGATIVE
CRYSTALS, UR, HPF, POC: NEGATIVE
MUCUS UA: NEGATIVE
RBC, URINE, MICROSCOPIC: NEGATIVE
Yeast, UA: NEGATIVE

## 2014-11-24 LAB — POCT WET PREP WITH KOH
KOH PREP POC: POSITIVE
Trichomonas, UA: NEGATIVE
Yeast Wet Prep HPF POC: POSITIVE

## 2014-11-24 LAB — POCT URINALYSIS DIPSTICK
BILIRUBIN UA: NEGATIVE
Blood, UA: NEGATIVE
GLUCOSE UA: NEGATIVE
KETONES UA: NEGATIVE
NITRITE UA: NEGATIVE
Protein, UA: NEGATIVE
Spec Grav, UA: 1.015
Urobilinogen, UA: 0.2
pH, UA: 7.5

## 2014-11-24 MED ORDER — PSEUDOEPHEDRINE-GUAIFENESIN ER 60-600 MG PO TB12: 1.0000 | ORAL_TABLET | Freq: Two times a day (BID) | ORAL | Status: AC

## 2014-11-24 MED ORDER — AMOXICILLIN-POT CLAVULANATE 875-125 MG PO TABS: 1.0000 | ORAL_TABLET | Freq: Two times a day (BID) | ORAL | Status: DC

## 2014-11-24 MED ORDER — FLUCONAZOLE 150 MG PO TABS: 150.0000 mg | ORAL_TABLET | Freq: Once | ORAL | Status: DC

## 2014-11-24 NOTE — Patient Instructions (Signed)
Sinusitis (Sinusitis) La sinusitis es el enrojecimiento, el dolor y la inflamacin de los senos paranasales. Los senos paranasales son bolsas de aire que se encuentran dentro de los huesos de la cara (por debajo de los ojos, en la mitad de la frente o por encima de los ojos). En los senos paranasales sanos, el moco puede drenar y el aire circula a travs de ellos en su camino hacia la nariz. Sin embargo, cuando se inflaman, el moco y el aire quedan atrapados. Esto hace que se desarrollen bacterias y otros grmenes que causan infeccin. La sinusitis puede desarrollarse rpidamente y durar solo un tiempo corto (aguda) o continuar por un perodo largo (crnica). La sinusitis que dura ms de 12 semanas se considera crnica.  CAUSAS  Las causas de la sinusitis son:  Cualquier alergia que tenga.  Las anomalas estructurales, como el desplazamiento del cartlago que separa las fosas nasales (desvo del tabique), que pueden disminuir el flujo de aire por la nariz y los senos paranasales, y afectar su drenaje.  Las anomalas funcionales, como cuando los pequeos pelos (cilias) que se encuentran en los senos paranasales y que ayudan a eliminar el moco no funcionan correctamente o no estn presentes. SIGNOS Y SNTOMAS  Los sntomas de la sinusitis aguda y crnica son los mismos. Los sntomas principales son el dolor y la presin alrededor de los senos paranasales afectados. Otros sntomas son:  Dolor en los dientes superiores.  Dolor de odos.  Dolor de cabeza.  Mal aliento.  Disminucin del sentido del olfato y del gusto.  Tos, que empeora al acostarse.  Fatiga.  Fiebre.  Drenaje de moco espeso por la nariz, que generalmente es de color verde y puede contener pus (purulento).  Hinchazn y calor en los senos paranasales afectados. DIAGNSTICO  Su mdico le realizar un examen fsico. Durante el examen, el mdico:  Le revisar la nariz para buscar signos de crecimientos anormales en las  fosas nasales (plipos nasales).  Palpar los senos paranasales afectados para buscar signos de infeccin.  Ver el interior de los senos paranasales (endoscopia) a travs de un dispositivo de obtencin de imgenes que tiene una luz conectada (endoscopio). Si el mdico sospecha que usted sufre sinusitis crnica, podr indicar una o ms de las siguientes pruebas:  Pruebas de alergia.  Cultivo de las secreciones nasales. Se extrae una muestra de moco de la nariz, que se enva al laboratorio para detectar bacterias.  Citologa nasal. Se extrae una muestra de moco de la nariz, que el mdico examina para determinar si la sinusitis est relacionada con una alergia. TRATAMIENTO  La mayora de los casos de sinusitis aguda se deben a una infeccin viral y se resuelven espontneamente en un perodo de 10das. En algunos casos, se recetan medicamentos para aliviar los sntomas (analgsicos, descongestivos, aerosoles nasales con corticoides o aerosoles salinos).  Sin embargo, para la sinusitis por infeccin bacteriana, el mdico le recetar antibiticos. Los antibiticos son medicamentos que destruyen las bacterias que causan la infeccin.  Con poca frecuencia, la sinusitis tiene su origen en una infeccin por hongos. En estos casos, el mdico recetar un medicamento antimictico. Para algunos casos de sinusitis crnica, es necesario someterse a una ciruga. Generalmente, se trata de casos en los que la sinusitis se repite ms de 3veces al ao, a pesar de otros tratamientos. INSTRUCCIONES PARA EL CUIDADO EN EL HOGAR   Beber gran cantidad de lquidos. Los lquidos ayudan a disolver el moco, para que drene ms fcilmente de los senos paranasales.    Use un humidificador.  Inhale vapor de 3 a 4 veces al da (por ejemplo, sintese en el bao con la ducha abierta).  Aplquese un pao tibio y hmedo en la cara 3 o 4 veces al da o segn las indicaciones del mdico.  Use un aerosol nasal salino para ayudar  a humedecer y limpiar los senos nasales.  Tome los medicamentos solamente como se lo haya indicado el mdico.  Si le recetaron un antimictico o un antibitico, asegrese de terminarlos, incluso si comienza a sentirse mejor. SOLICITE ATENCIN MDICA DE INMEDIATO SI:  Siente ms dolor o sufre dolores de cabeza intensos.  Tiene nuseas, vmitos o somnolencia.  Observa hinchazn alrededor del rostro.  Tiene problemas de visin.  Presenta rigidez en el cuello.  Tiene dificultad para respirar. ASEGRESE DE QUE:   Comprende estas instrucciones.  Controlar su afeccin.  Recibir ayuda de inmediato si no mejora o si empeora. Document Released: 01/05/2005 Document Revised: 08/12/2013 Exit[employer] Patient Information 2015 [employer]. This information is not intended to replace advice given to you by your health care provider. Make sure you discuss any questions you have with your health care provider.  

## 2014-11-24 NOTE — Progress Notes (Signed)
Subjective:  Patient ID: Elizabeth Gilbert, female    DOB: 08-Sep-1975  Age: 39 y.o. MRN: [id number]  CC: Sore Throat; Headache; burning with urination; and Vaginal Itching   HPI Elizabeth Gilbert presents  with numerous complaints. She has nasal congestion postnasal drainage and sore throat. She has purulent nasal discharge. She is in no cough. She has no fever chills. No weakness no nausea or vomiting.  She does have vaginal itching. Some burning when she urinates. She denies any vaginal discharge urgency or frequency. She has no fever or chills. Had no improvement with over-the-counter medication  History Elizabeth Gilbert has no past medical history on file.   She has no past surgical history on file.   Her  family history is not on file.  She   reports that she has never smoked. She does not have any smokeless tobacco history on file. She reports that she does not drink alcohol or use illicit drugs.  Outpatient Prescriptions Prior to Visit  Medication Sig Dispense Refill  . albuterol (PROVENTIL HFA;VENTOLIN HFA) 108 (90 BASE) MCG/ACT inhaler Inhale 2 puffs into the lungs every 6 (six) hours as needed for wheezing or shortness of breath. (Patient not taking: Reported on 11/24/2014) 1 Inhaler 2  . azithromycin (ZITHROMAX) 250 MG tablet Use as a zpack (Patient not taking: Reported on 11/24/2014) 6 tablet 0  . diphenhydrAMINE (SOMINEX) 25 MG tablet Take 25 mg by mouth at bedtime as needed for sleep.    Marland Kitchen loratadine (CLARITIN) 10 MG tablet Take 10 mg by mouth daily.    . predniSONE (DELTASONE) 20 MG tablet Take 2 pills a day for 3 days (Patient not taking: Reported on 11/24/2014) 6 tablet 0   No facility-administered medications prior to visit.    Social History   Social History  . Marital Status: Married    Spouse Name: N/A  . Number of Children: N/A  . Years of Education: N/A   Social History Main Topics  . Smoking status: Never Smoker   . Smokeless tobacco: None  . Alcohol Use: No  . Drug  Use: No  . Sexual Activity: Yes   Other Topics Concern  . None   Social History Narrative     Review of Systems  Constitutional: Negative for fever, chills and appetite change.  HENT: Negative for congestion, ear pain, postnasal drip, sinus pressure and sore throat.   Eyes: Negative for pain and redness.  Respiratory: Negative for cough, shortness of breath and wheezing.   Cardiovascular: Negative for leg swelling.  Gastrointestinal: Negative for nausea, vomiting, abdominal pain, diarrhea, constipation and blood in stool.  Endocrine: Negative for polyuria.  Genitourinary: Positive for dysuria. Negative for urgency, frequency and flank pain.  Musculoskeletal: Negative for gait problem.  Skin: Negative for rash.  Neurological: Negative for weakness and headaches.  Psychiatric/Behavioral: Negative for confusion and decreased concentration. The patient is not nervous/anxious.     Objective:  BP 132/70 mmHg  Pulse 97  Temp(Src) 98.6 F (37 C) (Oral)  Resp 18  Ht  (1.626 m)  Wt 152 lb (68.947 kg)  BMI 26.08 kg/m2  SpO2 98%  LMP 11/02/2014  Physical Exam  Constitutional: She is oriented to person, place, and time. She appears well-developed and well-nourished.  HENT:  Head: Normocephalic and atraumatic.  Right Ear: External ear normal.  Left Ear: External ear normal.  Nose: Nose normal.  Mouth/Throat: Oropharynx is clear and moist.  Eyes: Conjunctivae are normal. Pupils are equal, round, and reactive to light.  Neck: Normal range of motion. Neck supple.  Cardiovascular: Normal rate.   Pulmonary/Chest: Effort normal and breath sounds normal. No respiratory distress.  Abdominal: Soft.  Musculoskeletal: She exhibits no edema.  Neurological: She is alert and oriented to person, place, and time.  Skin: Skin is dry.  Psychiatric: She has a normal mood and affect. Her behavior is normal. Thought content normal.      Assessment & Plan:   Elizabeth Gilbert was seen today for sore  throat, headache, burning with urination and vaginal itching.  Diagnoses and all orders for this visit:  Burning with urination -     POCT urinalysis dipstick -     POCT UA - Microscopic Only  Acute pansinusitis, recurrence not specified  Vaginal itching -     POCT Wet Prep with KOH  Other orders -     amoxicillin-clavulanate (AUGMENTIN) 875-125 MG per tablet; Take 1 tablet by mouth 2 (two) times daily. -     fluconazole (DIFLUCAN) 150 MG tablet; Take 1 tablet (150 mg total) by mouth once. Repeat if needed -     pseudoephedrine-guaifenesin (MUCINEX D) 60-600 MG per tablet; Take 1 tablet by mouth every 12 (twelve) hours.   I am having Elizabeth Gilbert start on amoxicillin-clavulanate, fluconazole, and pseudoephedrine-guaifenesin. I am also having her maintain her loratadine, diphenhydrAMINE, albuterol, predniSONE, and azithromycin.  Meds ordered this encounter  Medications  . amoxicillin-clavulanate (AUGMENTIN) 875-125 MG per tablet    Sig: Take 1 tablet by mouth 2 (two) times daily.    Dispense:  20 tablet    Refill:  0  . fluconazole (DIFLUCAN) 150 MG tablet    Sig: Take 1 tablet (150 mg total) by mouth once. Repeat if needed    Dispense:  2 tablet    Refill:  0  . pseudoephedrine-guaifenesin (MUCINEX D) 60-600 MG per tablet    Sig: Take 1 tablet by mouth every 12 (twelve) hours.    Dispense:  18 tablet    Refill:  0    Appropriate red flag conditions were discussed with the patient as well as actions that should be taken.  Patient expressed his understanding.  Follow-up: Return if symptoms worsen or fail to improve.  Carmelina Dane, MD   Results for orders placed or performed in visit on 11/24/14  POCT urinalysis dipstick  Result Value Ref Range   Color, UA yellow    Clarity, UA clear    Glucose, UA neg    Bilirubin, UA neg    Ketones, UA neg    Spec Grav, UA 1.015    Blood, UA neg    pH, UA 7.5    Protein, UA neg    Urobilinogen, UA 0.2    Nitrite, UA neg     Leukocytes, UA small (1+) (A) Negative  POCT UA - Microscopic Only  Result Value Ref Range   WBC, Ur, HPF, POC 0-3    RBC, urine, microscopic neg    Bacteria, U Microscopic neg    Mucus, UA neg    Epithelial cells, urine per micros 0-1    Crystals, Ur, HPF, POC neg    Casts, Ur, LPF, POC neg    Yeast, UA neg   POCT Wet Prep with KOH  Result Value Ref Range   Trichomonas, UA Negative    Clue Cells Wet Prep HPF POC 0-1    Epithelial Wet Prep HPF POC Moderate Few, Moderate, Many   Yeast Wet Prep HPF POC positive    Bacteria  Wet Prep HPF POC Moderate (A) None, Few   RBC Wet Prep HPF POC 0-2    WBC Wet Prep HPF POC 3-10    KOH Prep POC Positive

## 2019-09-20 ENCOUNTER — Encounter (HOSPITAL_COMMUNITY): Payer: Self-pay

## 2019-09-20 ENCOUNTER — Ambulatory Visit (HOSPITAL_COMMUNITY)
Admission: EM | Admit: 2019-09-20 | Discharge: 2019-09-20 | Disposition: A | Discharge status: Home / Self Care | Payer: Self-pay | Attending: Internal Medicine | Admitting: Internal Medicine

## 2019-09-20 ENCOUNTER — Other Ambulatory Visit: Payer: Self-pay

## 2019-09-20 DIAGNOSIS — R5383 Other fatigue: Secondary | ICD-10-CM | POA: Insufficient documentation

## 2019-09-20 HISTORY — DX: Hyperlipidemia, unspecified: E78.5

## 2019-09-20 LAB — VITAMIN D 25 HYDROXY (VIT D DEFICIENCY, FRACTURES): Vit D, 25-Hydroxy: 15.86 ng/mL — ABNORMAL LOW (ref 30–100)

## 2019-09-20 LAB — COMPREHENSIVE METABOLIC PANEL
ALT: 44 U/L (ref 0–44)
AST: 64 U/L — ABNORMAL HIGH (ref 15–41)
Albumin: 5.1 g/dL — ABNORMAL HIGH (ref 3.5–5.0)
Alkaline Phosphatase: 82 U/L (ref 38–126)
Anion gap: 13 (ref 5–15)
BUN: 9 mg/dL (ref 6–20)
CO2: 23 mmol/L (ref 22–32)
Calcium: 9.8 mg/dL (ref 8.9–10.3)
Chloride: 103 mmol/L (ref 98–111)
Creatinine, Ser: 1.04 mg/dL — ABNORMAL HIGH (ref 0.44–1.00)
GFR calc Af Amer: >60 mL/min (ref >60)
GFR calc non Af Amer: >60 mL/min (ref >60)
Glucose, Bld: 101 mg/dL — ABNORMAL HIGH (ref 70–99)
Potassium: 3.8 mmol/L (ref 3.5–5.1)
Sodium: 139 mmol/L (ref 135–145)
Total Bilirubin: 0.9 mg/dL (ref 0.3–1.2)
Total Protein: 8.3 g/dL — ABNORMAL HIGH (ref 6.5–8.1)

## 2019-09-20 LAB — CBC WITH DIFFERENTIAL/PLATELET
Abs Immature Granulocytes: 0.03 10*3/uL (ref 0.00–0.07)
Basophils Absolute: 0 10*3/uL (ref 0.0–0.1)
Basophils Relative: 0 %
Eosinophils Absolute: 0 10*3/uL (ref 0.0–0.5)
Eosinophils Relative: 0 %
HCT: 47.4 % — ABNORMAL HIGH (ref 36.0–46.0)
Hemoglobin: 15.8 g/dL — ABNORMAL HIGH (ref 12.0–15.0)
Immature Granulocytes: 0 %
Lymphocytes Relative: 20 %
Lymphs Abs: 1.9 10*3/uL (ref 0.7–4.0)
MCH: 30.7 pg (ref 26.0–34.0)
MCHC: 33.3 g/dL (ref 30.0–36.0)
MCV: 92 fL (ref 80.0–100.0)
Monocytes Absolute: 0.8 10*3/uL (ref 0.1–1.0)
Monocytes Relative: 8 %
Neutro Abs: 7 10*3/uL (ref 1.7–7.7)
Neutrophils Relative %: 72 %
Platelets: 209 10*3/uL (ref 150–400)
RBC: 5.15 MIL/uL — ABNORMAL HIGH (ref 3.87–5.11)
RDW: 13.8 % (ref 11.5–15.5)
WBC: 9.8 10*3/uL (ref 4.0–10.5)
nRBC: 0 % (ref 0.0–0.2)

## 2019-09-20 LAB — CK: Total CK: 2205 U/L — ABNORMAL HIGH (ref 38–234)

## 2019-09-20 NOTE — ED Triage Notes (Signed)
Pt c/o 6/10 sharp pain in left sided of chestx2 days. The pain started in right chest 2 days ago, but has moved to left chest today. Pt also having dizziness, SOB, HA, fatigue today. Pt has non labored breathing. Pt able to speak in complete sentences.

## 2019-09-22 NOTE — ED Provider Notes (Addendum)
[facility]    CSN: [id number] Arrival date & time: 09/20/19  1733      History   Chief Complaint Chief Complaint  Patient presents with  . Chest Pain    HPI Elizabeth Gilbert is a 44 y.o. female who was recently started on Crestor for hyperlipidemia comes to urgent care with complaints of 6 out of 10 sharp left-sided chest pain as well as back pain.  Pain has been on both the right side and the left side over the past couple of days.  She denies any radiation.  Is aggravated by palpation.  No known relieving factors.  She denies any cold sweats, dizziness or nausea.  She denies any fatigue.  No fever or chills.  No weight changes.   HPI  Past Medical History:  Diagnosis Date  . Hyperlipidemia     Patient Active Problem List   Diagnosis Date Noted  . History of thrombocytopenia 11/04/2010  . Late prenatal care 11/04/2010  . Active labor 11/04/2010    History reviewed. No pertinent surgical history.  OB History    Gravida  3   Para  3   Term  3   Preterm      AB      Living  3     SAB      TAB      Ectopic      Multiple      Live Births  1            Home Medications    Prior to Admission medications   Medication Sig Start Date End Date Taking? Authorizing Provider  dimenhyDRINATE (DRAMAMINE) 50 MG tablet Take 50 mg by mouth every 8 (eight) hours as needed.   Yes [provider]  rosuvastatin (CRESTOR) 40 MG tablet Take 40 mg by mouth daily.   Yes [provider]  diphenhydrAMINE (SOMINEX) 25 MG tablet Take 25 mg by mouth at bedtime as needed for sleep.    [provider]  loratadine (CLARITIN) 10 MG tablet Take 10 mg by mouth daily.    [provider]  albuterol (PROVENTIL HFA;VENTOLIN HFA) 108 (90 BASE) MCG/ACT inhaler Inhale 2 puffs into the lungs every 6 (six) hours as needed for wheezing or shortness of breath. Patient not taking: Reported on 11/24/2014 07/31/14 09/20/19  Copland, Gwenlyn Found, MD     Family History No family history on file.  Social History Social History   Tobacco Use  . Smoking status: Never Smoker  Substance Use Topics  . Alcohol use: No    Alcohol/week: 0.0 standard drinks  . Drug use: No     Allergies   Patient has no known allergies.   Review of Systems Review of Systems  Constitutional: Negative.   HENT: Negative.   Respiratory: Negative for chest tightness, shortness of breath, wheezing and stridor.   Cardiovascular: Positive for chest pain.  Gastrointestinal: Negative.   Genitourinary: Negative.   Musculoskeletal: Negative.   Skin: Negative.  Negative for color change, pallor and rash.  Neurological: Negative.      Physical Exam Triage Vital Signs ED Triage Vitals  Enc Vitals Group     BP 09/20/19 1746 (!) 152/85     Pulse Rate 09/20/19 1746 88     Resp 09/20/19 1746 18     Temp 09/20/19 1746 98.9 F (37.2 C)     Temp Source 09/20/19 1746 Oral     SpO2 --      Weight  09/20/19 1750 172 lb (78 kg)     Height 09/20/19 1750 5\' 3"  (1.6 m)     Head Circumference --      Peak Flow --      Pain Score 09/20/19 1750 6     Pain Loc --      Pain Edu? --      Excl. in [place]? --    No data found.  Updated Vital Signs BP (!) 152/85   Pulse 88   Temp 98.9 F (37.2 C) (Oral)   Resp 18   Ht 5\' 3"  (1.6 m)   Wt 78 kg   BMI 30.47 kg/m   Visual Acuity Right Eye Distance:   Left Eye Distance:   Bilateral Distance:    Right Eye Near:   Left Eye Near:    Bilateral Near:     Physical Exam Vitals and nursing note reviewed.  Constitutional:      General: She is not in acute distress.    Appearance: She is not ill-appearing.  HENT:     Head: Normocephalic.  Neck:     Thyroid: No thyromegaly.  Cardiovascular:     Rate and Rhythm: Normal rate and regular rhythm.     Heart sounds: Normal heart sounds.  Pulmonary:     Breath sounds: No decreased breath sounds, wheezing, rhonchi or rales.  Chest:     Chest wall: No mass or  deformity.  Abdominal:     General: Bowel sounds are normal.     Palpations: Abdomen is soft.  Musculoskeletal:     Cervical back: Normal range of motion.  Lymphadenopathy:     Cervical: No cervical adenopathy.  Neurological:     Mental Status: She is alert.      UC Treatments / Results  Labs (all labs ordered are listed, but only abnormal results are displayed) Labs Reviewed  CBC WITH DIFFERENTIAL/PLATELET - Abnormal; Notable for the following components:      Result Value   RBC 5.15 (*)    Hemoglobin 15.8 (*)    HCT 47.4 (*)    All other components within normal limits  COMPREHENSIVE METABOLIC PANEL - Abnormal; Notable for the following components:   Glucose, Bld 101 (*)    Creatinine, Ser 1.04 (*)    Total Protein 8.3 (*)    Albumin 5.1 (*)    AST 64 (*)    All other components within normal limits  CK - Abnormal; Notable for the following components:   Total CK 2,205 (*)    All other components within normal limits  VITAMIN D 25 HYDROXY (VIT D DEFICIENCY, FRACTURES) - Abnormal; Notable for the following components:   Vit D, 25-Hydroxy 15.86 (*)    All other components within normal limits    EKG   Radiology No results found.  Procedures Procedures (including critical care time)  Medications Ordered in UC Medications - No data to display  Initial Impression / Assessment and Plan / UC Course  I have reviewed the triage vital signs and the nursing notes.  Pertinent labs & imaging results that were available during my care of the patient were reviewed by me and considered in my medical decision making (see chart for details).     1.  Musculoskeletal chest wall pain-likely statin use related: CMP, creatinine kinase, vitamin D level. EKG shows normal sinus rhythm. If creatinine kinase oral liver enzymes are deranged/increased patient may benefit from either reducing the dose of the statin or discontinuing it.  Patient is advised to continue taking the statin  at this time.  Return if symptoms are worse. Return precautions given. Final Clinical Impressions(s) / UC Diagnoses   Final diagnoses:  Fatigue, unspecified type   Discharge Instructions   None    ED Prescriptions    None     PDMP not reviewed this encounter.   Merrilee Jansky, MD 09/22/19 1321    Merrilee Jansky, MD 09/22/19 1324

## 2020-03-02 ENCOUNTER — Other Ambulatory Visit: Payer: Self-pay

## 2020-03-02 DIAGNOSIS — Z20822 Contact with and (suspected) exposure to covid-19: Secondary | ICD-10-CM

## 2020-03-04 LAB — SARS-COV-2, NAA 2 DAY TAT

## 2020-03-04 LAB — NOVEL CORONAVIRUS, NAA: SARS-CoV-2, NAA: DETECTED — AB

## 2020-03-05 ENCOUNTER — Encounter: Payer: Self-pay | Admitting: Nurse Practitioner

## 2020-03-05 ENCOUNTER — Telehealth: Payer: Self-pay | Admitting: Nurse Practitioner

## 2020-03-05 DIAGNOSIS — U071 COVID-19: Secondary | ICD-10-CM

## 2020-03-05 NOTE — Telephone Encounter (Signed)
Called to discuss with Rory Montel about Covid symptoms and the use of a monoclonal antibody infusion for those with mild to moderate Covid symptoms and at a high risk of hospitalization.     Pt is qualified for this infusion at the [facility] center due to co-morbid conditions and/or a member of an at-risk group, however declines infusion at this time. Symptoms tier reviewed as well as criteria for ending isolation.  Symptoms reviewed that would warrant ED/Hospital evaluation. Preventative practices reviewed. Patient verbalized understanding. Patient advised to call back if he/she opts to proceed with infusion. Callback number provided. Urgent care and/or ER precautions given for severe symptoms. Last date eligible for infusion: 03/08/20  Patient Active Problem List   Diagnosis Date Noted  . History of thrombocytopenia 11/04/2010  . Late prenatal care 11/04/2010  . Active labor 11/04/2010   Consuello Masse, NP [phone] [email or web address] [email or web address]

## 2020-03-09 ENCOUNTER — Telehealth (HOSPITAL_COMMUNITY): Payer: Self-pay

## 2020-03-10 ENCOUNTER — Telehealth (HOSPITAL_COMMUNITY): Payer: Self-pay

## 2020-03-10 NOTE — Telephone Encounter (Signed)
Done

## 2020-03-10 NOTE — Telephone Encounter (Signed)
Patient is post 10 day treatment. Patient called yesterday on her day 11 to seek treatment with antibody therapy but told patient she no longer qualified for treatment but gave number for post covid clinic.   Called patient today to follow up with to see if she was able to schedule appt with [address] [facility]. She states she made a video visit appointment and was given prescriptions to help with her symptoms. All questions answered.
